# Patient Record
Sex: Female | Born: 1985 | Race: White | Hispanic: No | Marital: Married
Health system: Southern US, Community
[De-identification: ages and names within clinical notes are randomized; demographics above are authoritative.]

---

## 2013-05-03 ENCOUNTER — Ambulatory Visit: Payer: Self-pay | Admitting: Family Medicine

## 2013-05-03 LAB — DOT URINE DIP
Protein: NEGATIVE
Specific Gravity: 1.01 (ref 1.003–1.030)

## 2014-03-26 ENCOUNTER — Ambulatory Visit: Payer: Self-pay

## 2014-06-10 ENCOUNTER — Ambulatory Visit: Payer: Self-pay | Admitting: Family Medicine

## 2014-06-10 LAB — URINALYSIS, COMPLETE
Bilirubin,UR: NEGATIVE
Blood: NEGATIVE
GLUCOSE, UR: NEGATIVE
Ketone: NEGATIVE
NITRITE: NEGATIVE
PH: 7.5 (ref 5.0–8.0)
Protein: NEGATIVE
Specific Gravity: 1.01 (ref 1.000–1.030)

## 2014-06-13 LAB — URINE CULTURE

## 2015-04-14 ENCOUNTER — Emergency Department
Admission: EM | Admit: 2015-04-14 | Discharge: 2015-04-15 | Disposition: A | Discharge status: Home / Self Care | Payer: Self-pay | Attending: Emergency Medicine | Admitting: Emergency Medicine

## 2015-04-14 ENCOUNTER — Emergency Department: Payer: Self-pay

## 2015-04-14 DIAGNOSIS — Y9229 Other specified public building as the place of occurrence of the external cause: Secondary | ICD-10-CM | POA: Insufficient documentation

## 2015-04-14 DIAGNOSIS — S01112A Laceration without foreign body of left eyelid and periocular area, initial encounter: Secondary | ICD-10-CM | POA: Insufficient documentation

## 2015-04-14 DIAGNOSIS — W2209XA Striking against other stationary object, initial encounter: Secondary | ICD-10-CM | POA: Insufficient documentation

## 2015-04-14 DIAGNOSIS — Y9389 Activity, other specified: Secondary | ICD-10-CM | POA: Insufficient documentation

## 2015-04-14 DIAGNOSIS — Z23 Encounter for immunization: Secondary | ICD-10-CM | POA: Insufficient documentation

## 2015-04-14 DIAGNOSIS — Y998 Other external cause status: Secondary | ICD-10-CM | POA: Insufficient documentation

## 2015-04-14 DIAGNOSIS — S0181XA Laceration without foreign body of other part of head, initial encounter: Secondary | ICD-10-CM

## 2015-04-14 MED ORDER — TETANUS-DIPHTH-ACELL PERTUSSIS 5-2.5-18.5 LF-MCG/0.5 IM SUSP
0.5000 mL | Freq: Once | INTRAMUSCULAR | Status: AC
  Administered 2015-04-15: 0.5 mL via INTRAMUSCULAR
  Filled 2015-04-14: qty 0.5
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
## 2015-04-15 MED ORDER — LABETALOL HCL 5 MG/ML IV SOLN
INTRAVENOUS | Status: AC
  Filled 2015-04-15: qty 4

## 2015-04-15 NOTE — Discharge Instructions (Signed)
1. Your tetanus has been updated and will be good for 10 years. 2. You may remove the medical tape in 5-7 days. 3. Return to the ER for worsening symptoms, persistent vomiting, lethargy or other concerns.  Facial Laceration  A facial laceration is a cut on the face. These injuries can be painful and cause bleeding. Lacerations usually heal quickly, but they need special care to reduce scarring. DIAGNOSIS  Your health care provider will take a medical history, ask for details about how the injury occurred, and examine the wound to determine how deep the cut is. TREATMENT  Some facial lacerations may not require closure. Others may not be able to be closed because of an increased risk of infection. The risk of infection and the chance for successful closure will depend on various factors, including the amount of time since the injury occurred. The wound may be cleaned to help prevent infection. If closure is appropriate, pain medicines may be given if needed. Your health care provider will use stitches (sutures), wound glue (adhesive), or skin adhesive strips to repair the laceration. These tools bring the skin edges together to allow for faster healing and a better cosmetic outcome. If needed, you may also be given a tetanus shot. HOME CARE INSTRUCTIONS  Only take over-the-counter or prescription medicines as directed by your health care provider.  Follow your health care provider's instructions for wound care. These instructions will vary depending on the technique used for closing the wound. For Sutures:  Keep the wound clean and dry.   If you were given a bandage (dressing), you should change it at least once a day. Also change the dressing if it becomes wet or dirty, or as directed by your health care provider.   Wash the wound with soap and water 2 times a day. Rinse the wound off with water to remove all soap. Pat the wound dry with a clean towel.   After cleaning, apply a thin layer  of the antibiotic ointment recommended by your health care provider. This will help prevent infection and keep the dressing from sticking.   You may shower as usual after the first 24 hours. Do not soak the wound in water until the sutures are removed.   Get your sutures removed as directed by your health care provider. With facial lacerations, sutures should usually be taken out after 4-5 days to avoid stitch marks.   Wait a few days after your sutures are removed before applying any makeup. For Skin Adhesive Strips:  Keep the wound clean and dry.   Do not get the skin adhesive strips wet. You may bathe carefully, using caution to keep the wound dry.   If the wound gets wet, pat it dry with a clean towel.   Skin adhesive strips will fall off on their own. You may trim the strips as the wound heals. Do not remove skin adhesive strips that are still stuck to the wound. They will fall off in time.  For Wound Adhesive:  You may briefly wet your wound in the shower or bath. Do not soak or scrub the wound. Do not swim. Avoid periods of heavy sweating until the skin adhesive has fallen off on its own. After showering or bathing, gently pat the wound dry with a clean towel.   Do not apply liquid medicine, cream medicine, ointment medicine, or makeup to your wound while the skin adhesive is in place. This may loosen the film before your wound is healed.  If a dressing is placed over the wound, be careful not to apply tape directly over the skin adhesive. This may cause the adhesive to be pulled off before the wound is healed.   Avoid prolonged exposure to sunlight or tanning lamps while the skin adhesive is in place.  The skin adhesive will usually remain in place for 5-10 days, then naturally fall off the skin. Do not pick at the adhesive film.  After Healing: Once the wound has healed, cover the wound with sunscreen during the day for 1 full year. This can help minimize scarring.  Exposure to ultraviolet light in the first year will darken the scar. It can take 1-2 years for the scar to lose its redness and to heal completely.  SEEK MEDICAL CARE IF:  You have a fever. SEEK IMMEDIATE MEDICAL CARE IF:  You have redness, pain, or swelling around the wound.   You see ayellowish-white fluid (pus) coming from the wound.    This information is not intended to replace advice given to you by your health care provider. Make sure you discuss any questions you have with your health care provider.   Document Released: 07/09/2004 Document Revised: 06/22/2014 Document Reviewed: 01/12/2013 Elsevier Interactive Patient Education [employer].

## 2015-04-15 NOTE — ED Notes (Signed)
Pt dc home ambulatory pt rates pain 4/10 instructed on follow up plan and wound care. PT NAD AT DC
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
